# Patient Record
Sex: Male | Born: 1950 | Race: White | Hispanic: No | Marital: Single | Smoking: Never smoker
Health system: Southern US, Community
[De-identification: ages and names within clinical notes are randomized; demographics above are authoritative.]

---

## 2008-05-14 ENCOUNTER — Ambulatory Visit: Payer: Self-pay | Admitting: Radiology

## 2008-05-14 ENCOUNTER — Ambulatory Visit: Payer: Self-pay | Admitting: Family Medicine

## 2008-05-14 ENCOUNTER — Emergency Department (HOSPITAL_BASED_OUTPATIENT_CLINIC_OR_DEPARTMENT_OTHER): Admission: EM | Admit: 2008-05-14 | Discharge: 2008-05-14 | Payer: Self-pay | Admitting: Emergency Medicine

## 2008-05-14 DIAGNOSIS — M20019 Mallet finger of unspecified finger(s): Secondary | ICD-10-CM | POA: Insufficient documentation

## 2008-05-14 DIAGNOSIS — S43016A Anterior dislocation of unspecified humerus, initial encounter: Secondary | ICD-10-CM | POA: Insufficient documentation

## 2008-05-14 DIAGNOSIS — S6390XA Sprain of unspecified part of unspecified wrist and hand, initial encounter: Secondary | ICD-10-CM | POA: Insufficient documentation

## 2008-05-14 DIAGNOSIS — S40019A Contusion of unspecified shoulder, initial encounter: Secondary | ICD-10-CM | POA: Insufficient documentation

## 2008-05-17 ENCOUNTER — Encounter: Payer: Self-pay | Admitting: Family Medicine

## 2009-10-08 IMAGING — CR DG SHOULDER 2+V*R*
4 series · 4 of 4 positions shown · non-contrast
Comparison: Film earlier today.

CLINICAL DATA: Postreduction.

RIGHT SHOULDER - 2+ VIEW

[w shoulder ap internal righ]
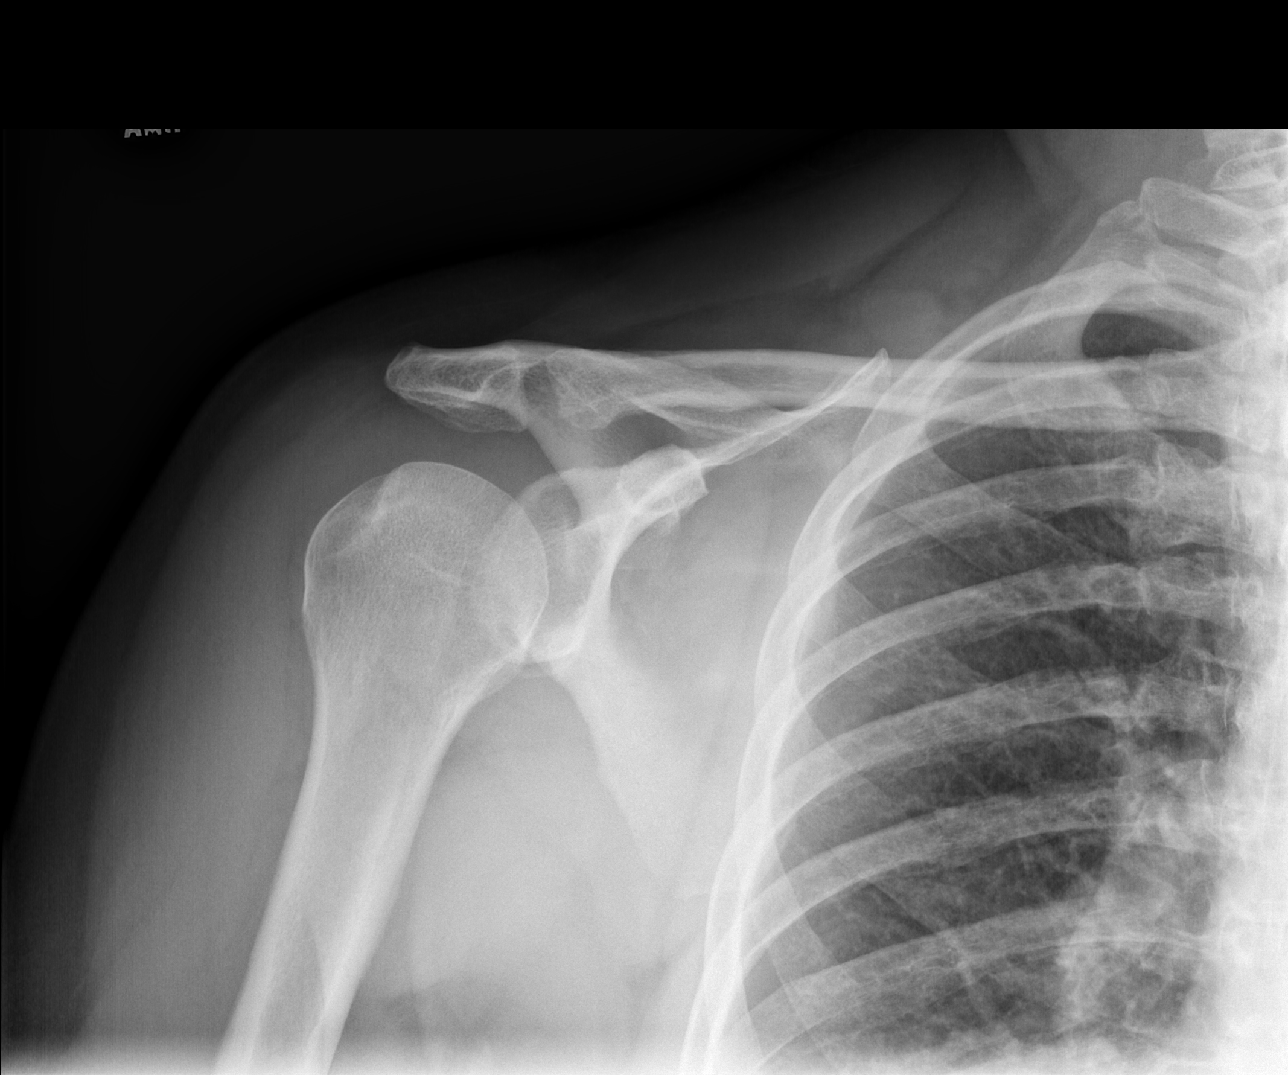

[w shoulder ap external righ (1 of 2)]
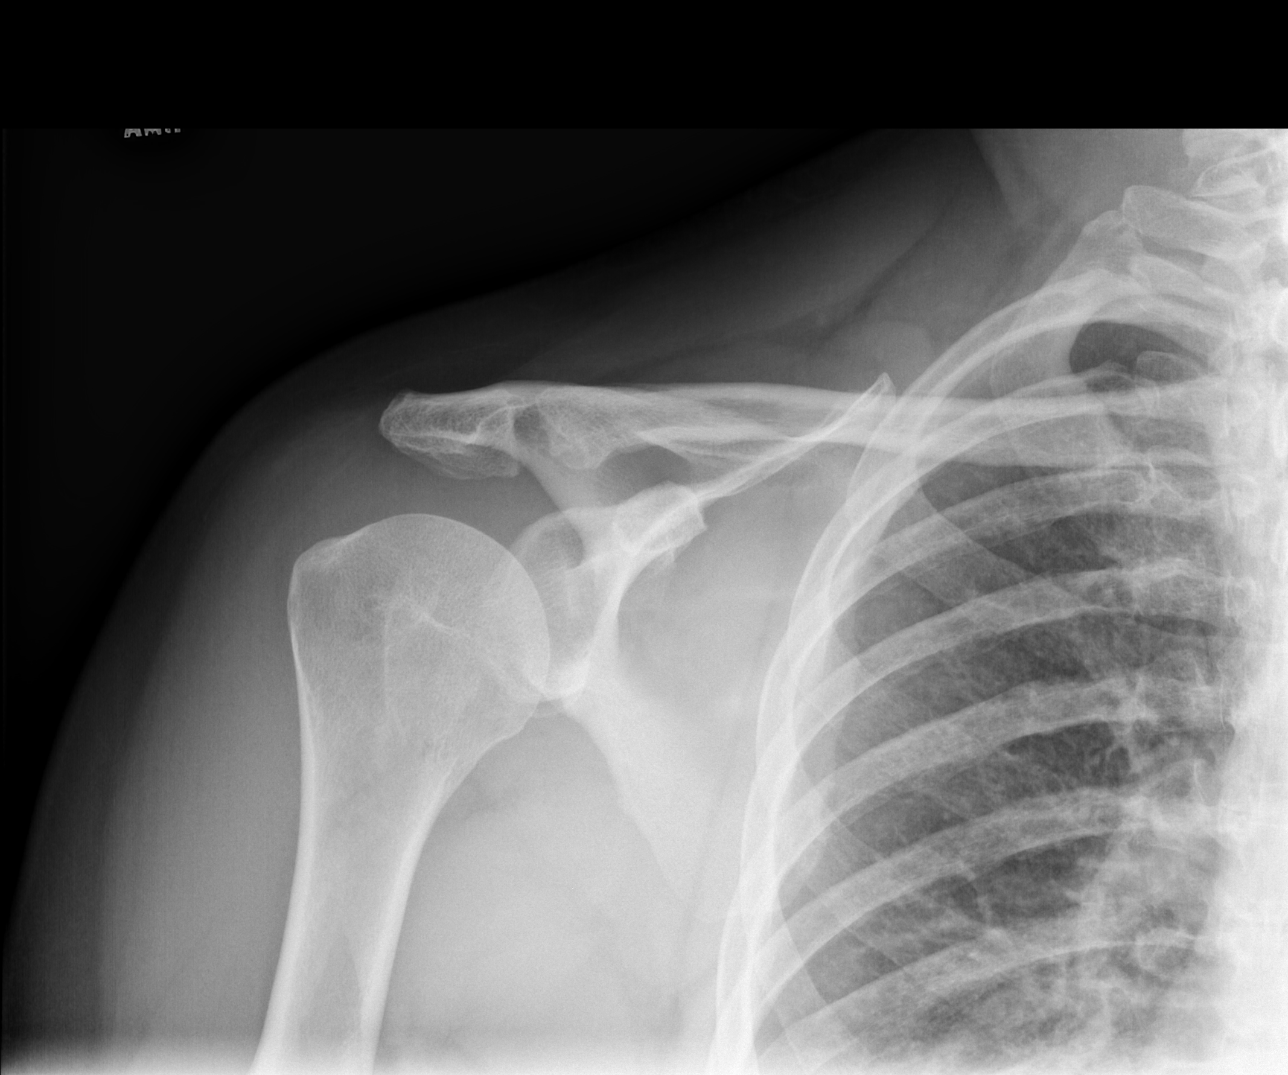

[w shoulder ap external righ (2 of 2)]
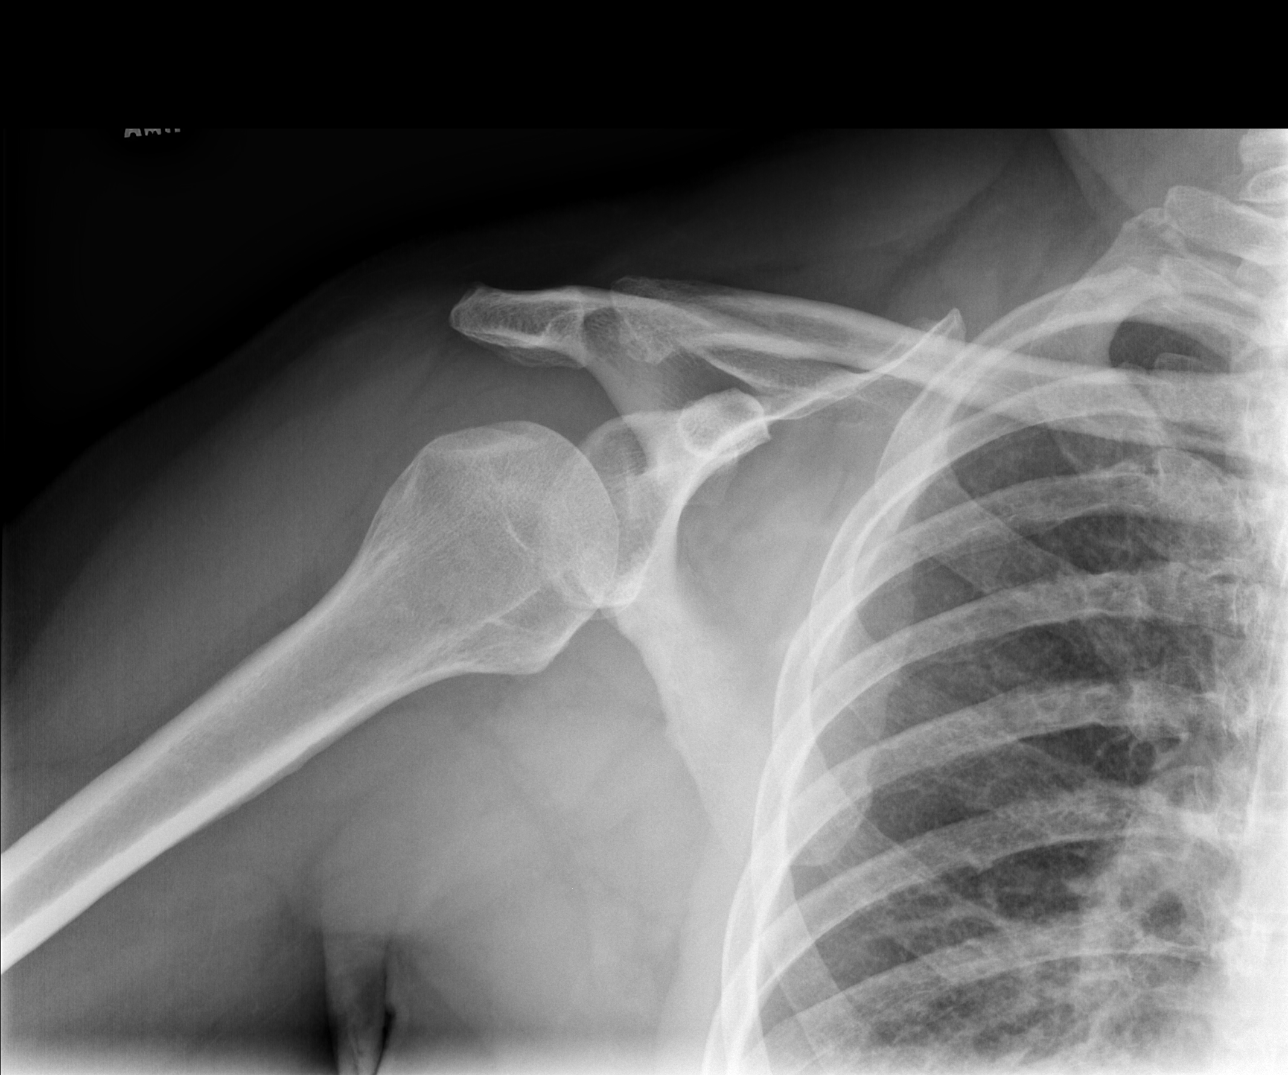

[w shoulder y view right]
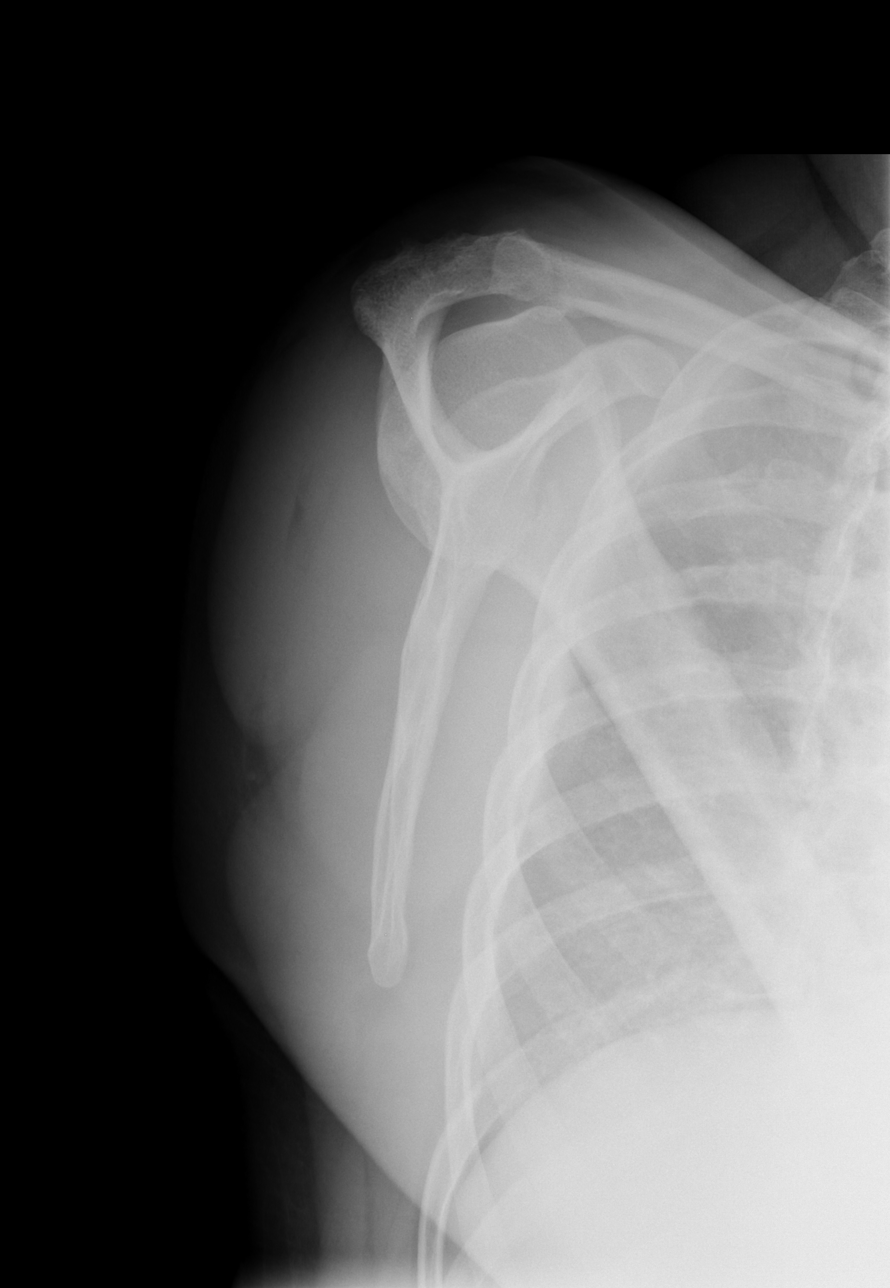

[4 of 4 positions shown; findings below may reference images not displayed]

FINDINGS: Right shoulder dislocation has been reduced.  No definite
fracture.
IMPRESSION: As above

## 2018-11-07 ENCOUNTER — Emergency Department (INDEPENDENT_AMBULATORY_CARE_PROVIDER_SITE_OTHER)
Admission: EM | Admit: 2018-11-07 | Discharge: 2018-11-07 | Disposition: A | Discharge status: Home / Self Care | Payer: BC Managed Care – PPO | Source: Home / Self Care | Attending: Emergency Medicine | Admitting: Emergency Medicine

## 2018-11-07 ENCOUNTER — Other Ambulatory Visit: Payer: Self-pay

## 2018-11-07 DIAGNOSIS — L237 Allergic contact dermatitis due to plants, except food: Secondary | ICD-10-CM | POA: Diagnosis not present

## 2018-11-07 MED ORDER — PREDNISONE 20 MG PO TABS: 20.0000 mg | ORAL_TABLET | Freq: Two times a day (BID) | ORAL | 0 refills | Status: AC

## 2018-11-07 MED ORDER — BETAMETHASONE DIPROPIONATE 0.05 % EX CREA: TOPICAL_CREAM | Freq: Two times a day (BID) | CUTANEOUS | 0 refills | Status: AC

## 2018-11-07 MED ORDER — METHYLPREDNISOLONE ACETATE 80 MG/ML IJ SUSP
80.0000 mg | Freq: Once | INTRAMUSCULAR | Status: AC
  Administered 2018-11-07: 80 mg via INTRAMUSCULAR

## 2018-11-07 NOTE — ED Triage Notes (Signed)
Pt c/o poison ivy rash on his LT middle finger x 2 days. Hx of bad rxn/spreading as kid.

## 2018-11-07 NOTE — ED Provider Notes (Signed)
[facility]    CSN: [id number] Arrival date & time: 11/07/18  1036      History   Chief Complaint Chief Complaint  Patient presents with  . Poison Ivy    HPI Benjamin SavageJerry P Manzo is a 68 y.o. male.   HPI Pt c/o poison ivy rash on his LT middle finger x 2 days.  Hx of bad rxn/spreading as kid.  Rash is very pruritic and he states it is "spreading" to left hand and left forearm, with a few areas on face today. He specifically requests a "cortisone shot" today.  He denies fever or chills or nausea or vomiting or breathing problems.  No dysphasia. He states that he follows up regularly with PCP to have glucose and A1c and cholesterol and other labs rechecked.  He states these have been controlled History reviewed. No pertinent past medical history.  Patient Active Problem List   Diagnosis Date Noted  . 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 CLOSED ANTERIOR DISLOCATION OF HUMERUS 05/14/2008  . FINGER SPRAIN 05/14/2008  . CONTUSION, SHOULDER 05/14/2008    History reviewed. No pertinent surgical history.     Home Medications    Prior to Admission medications   Medication Sig Start Date End Date Taking? Authorizing Provider  metFORMIN (GLUCOPHAGE-XR) 500 MG 24 hr tablet Take by mouth. 09/01/18 09/01/19 Yes [provider]  atorvastatin (LIPITOR) 40 MG tablet TAKE 1 TABLET BY MOUTH ONCE DAILY FOR CHOLESTEROL 10/11/18   [provider]  betamethasone dipropionate (DIPROLENE) 0.05 % cream Apply topically 2 (two) times daily. 11/07/18   Lajean ManesMassey, David, MD  predniSONE (DELTASONE) 20 MG tablet Take 1 tablet (20 mg total) by mouth 2 (two) times daily with a meal. X 5 days 11/07/18   Lajean ManesMassey, David, MD    Family History History reviewed. No pertinent family history.  Social History Social History   Tobacco Use  . Smoking status: Never Smoker  . Smokeless tobacco: Never Used  Substance Use Topics  . Alcohol use: Not on file  . Drug use: Not on file      Allergies   Patient has no known allergies.   Review of Systems Review of Systems  All other systems reviewed and are negative.  Pertinent items noted in HPI and remainder of comprehensive ROS otherwise negative.   Physical Exam Triage Vital Signs ED Triage Vitals  Enc Vitals Group     BP 11/07/18 1118 (!) 162/99     Pulse Rate 11/07/18 1118 76     Resp 11/07/18 1118 18     Temp 11/07/18 1118 98 F (36.7 C)     Temp Source 11/07/18 1118 Oral     SpO2 11/07/18 1118 97 %     Weight 11/07/18 1119 251 lb (113.9 kg)     Height 11/07/18 1119 5\' 8"  (1.727 m)     Head Circumference --      Peak Flow --      Pain Score 11/07/18 1119 0     Pain Loc --      Pain Edu? --      Excl. in GC? --    No data found.  Updated Vital Signs BP (!) 162/99 (BP Location: Right Arm)   Pulse 76   Temp 98 F (36.7 C) (Oral)   Resp 18   Ht 5\' 8"  (1.727 m)   Wt 113.9 kg   SpO2 97%   BMI 38.16 kg/m   Visual Acuity Right Eye Distance:  Left Eye Distance:   Bilateral Distance:    Right Eye Near:   Left Eye Near:    Bilateral Near:     Physical Exam Vitals signs reviewed.  Constitutional:      General: He is not in acute distress.    Appearance: He is well-developed.  HENT:     Head: Normocephalic and atraumatic.  Eyes:     General: No scleral icterus.    Pupils: Pupils are equal, round, and reactive to light.  Neck:     Musculoskeletal: Normal range of motion and neck supple.  Cardiovascular:     Rate and Rhythm: Normal rate and regular rhythm.  Pulmonary:     Effort: Pulmonary effort is normal.  Abdominal:     General: There is no distension.  Skin:    General: Skin is warm and dry.     Capillary Refill: Capillary refill takes less than 2 seconds.     Findings: Rash present.     Comments: Severe maculopapular poison ivy rash with few vesicles left hand and forearm.  Few areas on face and neck. No lip swelling.  Oral airway intact.  No wheezing or breathing problems.   Neurological:     General: No focal deficit present.     Mental Status: He is alert and oriented to person, place, and time.     Cranial Nerves: No cranial nerve deficit.  Psychiatric:        Behavior: Behavior normal.      UC Treatments / Results  Labs (all labs ordered are listed, but only abnormal results are displayed) Labs Reviewed - No data to display  EKG   Radiology No results found.  Procedures Procedures (including critical care time)  Medications Ordered in UC Medications  methylPREDNISolone acetate (DEPO-MEDROL) injection 80 mg (has no administration in time range)    Initial Impression / Assessment and Plan / UC Course  I have reviewed the triage vital signs and the nursing notes.  Pertinent labs & imaging results that were available during my care of the patient were reviewed by me and considered in my medical decision making (see chart for details).    He specifically requested a cortisone shot and after risk benefits alternatives discussed, Depo-Medrol 80 mg IM.   Prednisone tablets and betamethasone cream prescribed and other symptomatic care. Follow-up with your primary care doctor in 5-7 days if not improving, or sooner if symptoms become worse. Precautions discussed. Red flags discussed. Questions invited and answered. Patient voiced understanding and agreement.   He declined AVS  Final Clinical Impressions(s) / UC Diagnoses   Final diagnoses:  Poison ivy dermatitis   ED Prescriptions    Medication Sig Dispense Auth. Provider   predniSONE (DELTASONE) 20 MG tablet Take 1 tablet (20 mg total) by mouth 2 (two) times daily with a meal. X 5 days 10 tablet Jacqulyn Cane, MD   betamethasone dipropionate (DIPROLENE) 0.05 % cream Apply topically 2 (two) times daily. 30 g Jacqulyn Cane, MD        Jacqulyn Cane, MD 11/08/18 [phone]
